# Patient Record
Sex: Female | Born: 1970 | Race: Black or African American | Hispanic: No | Marital: Single | Smoking: Never smoker
Health system: Southern US, Community
[De-identification: ages and names within clinical notes are randomized; demographics above are authoritative.]

## PROBLEM LIST (undated history)

## (undated) DIAGNOSIS — I10 Essential (primary) hypertension: Secondary | ICD-10-CM

## (undated) DIAGNOSIS — E7800 Pure hypercholesterolemia, unspecified: Secondary | ICD-10-CM

## (undated) HISTORY — PX: ABDOMINAL HYSTERECTOMY: SHX81

## (undated) HISTORY — PX: PLANTAR FASCIA RELEASE: SHX2239

## (undated) HISTORY — PX: TARSAL TUNNEL RELEASE: SUR1099

---

## 2009-12-28 ENCOUNTER — Emergency Department (HOSPITAL_COMMUNITY): Admission: EM | Admit: 2009-12-28 | Discharge: 2009-12-28 | Payer: Self-pay | Admitting: Emergency Medicine

## 2010-06-23 ENCOUNTER — Emergency Department (HOSPITAL_COMMUNITY)
Admission: EM | Admit: 2010-06-23 | Discharge: 2010-06-24 | Disposition: A | Discharge status: Home / Self Care | Payer: Self-pay | Attending: Emergency Medicine | Admitting: Emergency Medicine

## 2010-06-23 DIAGNOSIS — R0789 Other chest pain: Secondary | ICD-10-CM | POA: Insufficient documentation

## 2010-06-23 DIAGNOSIS — R110 Nausea: Secondary | ICD-10-CM | POA: Insufficient documentation

## 2010-06-23 DIAGNOSIS — M546 Pain in thoracic spine: Secondary | ICD-10-CM | POA: Insufficient documentation

## 2010-06-23 DIAGNOSIS — E119 Type 2 diabetes mellitus without complications: Secondary | ICD-10-CM | POA: Insufficient documentation

## 2010-06-23 DIAGNOSIS — M79609 Pain in unspecified limb: Secondary | ICD-10-CM | POA: Insufficient documentation

## 2010-06-23 DIAGNOSIS — I10 Essential (primary) hypertension: Secondary | ICD-10-CM | POA: Insufficient documentation

## 2010-06-23 DIAGNOSIS — R209 Unspecified disturbances of skin sensation: Secondary | ICD-10-CM | POA: Insufficient documentation

## 2010-06-24 ENCOUNTER — Emergency Department (HOSPITAL_COMMUNITY): Payer: Self-pay

## 2010-06-24 LAB — POCT CARDIAC MARKERS
CKMB, poc: <1 ng/mL — ABNORMAL LOW (ref 1.0–8.0)
Myoglobin, poc: 46 ng/mL (ref 12–200)
Troponin i, poc: <0.05 ng/mL (ref 0.00–0.09)

## 2010-06-24 LAB — CBC
HCT: 36.6 % (ref 36.0–46.0)
Hemoglobin: 12.3 g/dL (ref 12.0–15.0)
MCH: 26.7 pg (ref 26.0–34.0)
RBC: 4.6 MIL/uL (ref 3.87–5.11)
WBC: 8.7 10*3/uL (ref 4.0–10.5)

## 2010-06-24 LAB — POCT I-STAT, CHEM 8
Calcium, Ion: 1.22 mmol/L (ref 1.12–1.32)
Chloride: 103 mEq/L (ref 96–112)
Creatinine, Ser: 0.7 mg/dL (ref 0.4–1.2)
Glucose, Bld: 183 mg/dL — ABNORMAL HIGH (ref 70–99)
TCO2: 25 mmol/L (ref 0–100)

## 2010-06-24 LAB — DIFFERENTIAL
Basophils Absolute: 0.1 10*3/uL (ref 0.0–0.1)
Basophils Relative: 1 % (ref 0–1)
Lymphocytes Relative: 44 % (ref 12–46)
Monocytes Absolute: 0.3 10*3/uL (ref 0.1–1.0)
Monocytes Relative: 4 % (ref 3–12)
Neutrophils Relative %: 50 % (ref 43–77)

## 2010-06-24 LAB — POCT PREGNANCY, URINE: Preg Test, Ur: NEGATIVE

## 2010-07-17 LAB — POCT CARDIAC MARKERS
CKMB, poc: <1 ng/mL — ABNORMAL LOW (ref 1.0–8.0)
Myoglobin, poc: 13.7 ng/mL (ref 12–200)
Troponin i, poc: <0.05 ng/mL (ref 0.00–0.09)

## 2010-07-17 LAB — URINALYSIS, ROUTINE W REFLEX MICROSCOPIC
Hgb urine dipstick: NEGATIVE
Ketones, ur: 15 mg/dL — AB
Protein, ur: NEGATIVE mg/dL
Specific Gravity, Urine: 1.037 — ABNORMAL HIGH (ref 1.005–1.030)
Urobilinogen, UA: 0.2 mg/dL (ref 0.0–1.0)

## 2010-07-17 LAB — DIFFERENTIAL
Basophils Absolute: 0.1 10*3/uL (ref 0.0–0.1)
Lymphocytes Relative: 42 % (ref 12–46)
Neutro Abs: 4.5 10*3/uL (ref 1.7–7.7)

## 2010-07-17 LAB — CBC
HCT: 38.8 % (ref 36.0–46.0)
Hemoglobin: 13.5 g/dL (ref 12.0–15.0)
MCH: 28.8 pg (ref 26.0–34.0)
MCV: 82.7 fL (ref 78.0–100.0)
RDW: 12.1 % (ref 11.5–15.5)

## 2010-07-17 LAB — BASIC METABOLIC PANEL
BUN: 11 mg/dL (ref 6–23)
CO2: 21 mEq/L (ref 19–32)
GFR calc Af Amer: >60 mL/min (ref >60)
Glucose, Bld: 371 mg/dL — ABNORMAL HIGH (ref 70–99)
Sodium: 133 mEq/L — ABNORMAL LOW (ref 135–145)

## 2010-07-17 LAB — URINE MICROSCOPIC-ADD ON

## 2011-03-29 ENCOUNTER — Encounter: Payer: Self-pay | Admitting: *Deleted

## 2011-03-29 ENCOUNTER — Emergency Department (HOSPITAL_COMMUNITY)
Admission: EM | Admit: 2011-03-29 | Discharge: 2011-03-29 | Disposition: A | Discharge status: Home / Self Care | Payer: Self-pay | Attending: Emergency Medicine | Admitting: Emergency Medicine

## 2011-03-29 DIAGNOSIS — E789 Disorder of lipoprotein metabolism, unspecified: Secondary | ICD-10-CM | POA: Insufficient documentation

## 2011-03-29 DIAGNOSIS — M62838 Other muscle spasm: Secondary | ICD-10-CM

## 2011-03-29 DIAGNOSIS — E119 Type 2 diabetes mellitus without complications: Secondary | ICD-10-CM | POA: Insufficient documentation

## 2011-03-29 DIAGNOSIS — M546 Pain in thoracic spine: Secondary | ICD-10-CM | POA: Insufficient documentation

## 2011-03-29 DIAGNOSIS — M2560 Stiffness of unspecified joint, not elsewhere classified: Secondary | ICD-10-CM | POA: Insufficient documentation

## 2011-03-29 DIAGNOSIS — I10 Essential (primary) hypertension: Secondary | ICD-10-CM | POA: Insufficient documentation

## 2011-03-29 HISTORY — DX: Essential (primary) hypertension: I10

## 2011-03-29 HISTORY — DX: Pure hypercholesterolemia, unspecified: E78.00

## 2011-03-29 MED ORDER — OXYCODONE-ACETAMINOPHEN 5-325 MG PO TABS
1.0000 | ORAL_TABLET | Freq: Four times a day (QID) | ORAL | Status: AC | PRN
Start: 2011-03-29 — End: 2011-04-08

## 2011-03-29 MED ORDER — OXYCODONE-ACETAMINOPHEN 5-325 MG PO TABS
1.0000 | ORAL_TABLET | Freq: Once | ORAL | Status: AC
  Administered 2011-03-29: 1 via ORAL
  Filled 2011-03-29: qty 1

## 2011-03-29 MED ORDER — CYCLOBENZAPRINE HCL 10 MG PO TABS: 10.0000 mg | ORAL_TABLET | Freq: Two times a day (BID) | ORAL | Status: AC | PRN

## 2011-03-29 NOTE — ED Provider Notes (Signed)
History     CSN: [id number] Arrival date & time: 03/29/2011  9:24 AM   First MD Initiated Contact with Patient 03/29/11 1027      Chief Complaint  Patient presents with  . Back Pain    (Consider location/radiation/quality/duration/timing/severity/associated sxs/prior treatment) Patient is a 40 y.o. female presenting with back pain. The history is provided by the patient.  Back Pain  This is a new problem. The current episode started 3 to 5 hours ago. The problem occurs constantly. The problem has not changed since onset.The pain is associated with no known injury. The pain is present in the thoracic spine. The quality of the pain is described as stabbing and shooting. The pain does not radiate. The pain is at a severity of 9/10. The pain is severe. The symptoms are aggravated by bending, twisting and certain positions. The pain is the same all the time. Stiffness is present all day. Pertinent negatives include no chest pain, no fever, no numbness, no paresthesias, no paresis, no tingling and no weakness. She has tried nothing for the symptoms. The treatment provided no relief.    Past Medical History  Diagnosis Date  . Diabetes mellitus   . Hypertension   . High cholesterol     History reviewed. No pertinent past surgical history.  History reviewed. No pertinent family history.  History  Substance Use Topics  . Smoking status: Never Smoker   . Smokeless tobacco: Not on file  . Alcohol Use: Yes     occ    OB History    Grav Para Term Preterm Abortions TAB SAB Ect Mult Living                  Review of Systems  Constitutional: Negative for fever.  Cardiovascular: Negative for chest pain.  Musculoskeletal: Positive for back pain.  Neurological: Negative for tingling, weakness, numbness and paresthesias.  All other systems reviewed and are negative.    Allergies  Review of patient's allergies indicates no known allergies.  Home Medications  No current outpatient  prescriptions on file.  BP 157/79  Pulse 90  Temp 97.2 F (36.2 C)  Resp 20  SpO2 100%  LMP 03/26/2011  Physical Exam  Nursing note and vitals reviewed. Constitutional: She is oriented to person, place, and time. She appears well-developed and well-nourished. She appears distressed.  HENT:  Head: Normocephalic and atraumatic.  Mouth/Throat: Oropharynx is clear and moist.  Eyes: EOM are normal. Pupils are equal, round, and reactive to light.  Neck: Normal range of motion. Neck supple. No spinous process tenderness and no muscular tenderness present. No rigidity. Normal range of motion present.  Cardiovascular: Normal rate, regular rhythm, normal heart sounds and intact distal pulses.   No murmur heard. Pulmonary/Chest: Effort normal and breath sounds normal. She has no wheezes. She has no rales.  Abdominal: Soft. She exhibits no distension. There is no tenderness. There is no CVA tenderness.  Musculoskeletal: She exhibits no tenderness.       Thoracic back: She exhibits decreased range of motion and tenderness.       Lumbar back: Normal. She exhibits normal range of motion and no tenderness.       Back:  Neurological: She is alert and oriented to person, place, and time. Coordination normal.  Reflex Scores:      Patellar reflexes are 1+ on the right side and 1+ on the left side. Skin: Skin is warm and dry. No rash noted.  Psychiatric: She has a  normal mood and affect.    ED Course  Procedures (including critical care time)  Labs Reviewed - No data to display No results found.   No diagnosis found.    MDM   Pt with left upper back pain consistent with trapezius spasm.  Pt denies any fever, SOB or resp sx.  No lower back pain or weakness, parasthesias.  Will treat pain and d/c home.        Gwyneth Sprout, MD 03/29/11 1051

## 2011-03-29 NOTE — ED Notes (Signed)
Reports waking up this am with upper back pain, increases with movement and turning her head. No acute distress noted at triage, ambulatory.

## 2011-09-17 ENCOUNTER — Emergency Department (HOSPITAL_COMMUNITY)
Admission: EM | Admit: 2011-09-17 | Discharge: 2011-09-18 | Disposition: A | Discharge status: Home / Self Care | Payer: Self-pay | Attending: Emergency Medicine | Admitting: Emergency Medicine

## 2011-09-17 DIAGNOSIS — A499 Bacterial infection, unspecified: Secondary | ICD-10-CM | POA: Insufficient documentation

## 2011-09-17 DIAGNOSIS — R102 Pelvic and perineal pain: Secondary | ICD-10-CM

## 2011-09-17 DIAGNOSIS — N949 Unspecified condition associated with female genital organs and menstrual cycle: Secondary | ICD-10-CM | POA: Insufficient documentation

## 2011-09-17 DIAGNOSIS — R300 Dysuria: Secondary | ICD-10-CM | POA: Insufficient documentation

## 2011-09-17 DIAGNOSIS — N760 Acute vaginitis: Secondary | ICD-10-CM | POA: Insufficient documentation

## 2011-09-17 DIAGNOSIS — B9689 Other specified bacterial agents as the cause of diseases classified elsewhere: Secondary | ICD-10-CM | POA: Insufficient documentation

## 2011-09-17 DIAGNOSIS — E119 Type 2 diabetes mellitus without complications: Secondary | ICD-10-CM | POA: Insufficient documentation

## 2011-09-17 DIAGNOSIS — E7800 Pure hypercholesterolemia, unspecified: Secondary | ICD-10-CM | POA: Insufficient documentation

## 2011-09-17 DIAGNOSIS — I10 Essential (primary) hypertension: Secondary | ICD-10-CM | POA: Insufficient documentation

## 2011-09-17 NOTE — ED Notes (Addendum)
Pt states burning with urination and frequent urination since yesterday. Pt states she has yeast infection before and is experiencing itching as well as open sores in vaginal area. Pt states she is taking doxycycline for diagnosed UTI. Pt had mirana placed in April 25th.

## 2011-09-18 ENCOUNTER — Encounter (HOSPITAL_COMMUNITY): Payer: Self-pay | Admitting: *Deleted

## 2011-09-18 LAB — WET PREP, GENITAL: Trich, Wet Prep: NONE SEEN

## 2011-09-18 LAB — URINALYSIS, ROUTINE W REFLEX MICROSCOPIC
Glucose, UA: >1000 mg/dL — AB
Hgb urine dipstick: NEGATIVE
Leukocytes, UA: NEGATIVE
Nitrite: NEGATIVE
Protein, ur: 30 mg/dL — AB
Urobilinogen, UA: 1 mg/dL (ref 0.0–1.0)

## 2011-09-18 MED ORDER — METRONIDAZOLE 500 MG PO TABS: 500.0000 mg | ORAL_TABLET | Freq: Two times a day (BID) | ORAL | Status: AC

## 2011-09-18 NOTE — ED Provider Notes (Signed)
History     CSN: [id number]  Arrival date & time 09/17/11  2355   First MD Initiated Contact with Patient 09/18/11 0038      Chief Complaint  Patient presents with  . Urinary Tract Infection    (Consider location/radiation/quality/duration/timing/severity/associated sxs/prior treatment) HPI Comments: Having burning with urination.  Had mirena placed in April.  Was started on doxy at that time for "an infection".  Denies discharge.  Sexually active with female partner.    Patient is a 41 y.o. female presenting with urinary tract infection. The history is provided by the patient.  Urinary Tract Infection This is a new problem. Episode onset: 2 days ago. The problem occurs constantly. The problem has been gradually worsening. Pertinent negatives include no abdominal pain. Exacerbated by: urinating. The symptoms are relieved by nothing. She has tried nothing for the symptoms.    Past Medical History  Diagnosis Date  . Diabetes mellitus   . Hypertension   . High cholesterol     History reviewed. No pertinent past surgical history.  History reviewed. No pertinent family history.  History  Substance Use Topics  . Smoking status: Never Smoker   . Smokeless tobacco: Not on file  . Alcohol Use: Yes     occ    OB History    Grav Para Term Preterm Abortions TAB SAB Ect Mult Living                  Review of Systems  Gastrointestinal: Negative for abdominal pain.  All other systems reviewed and are negative.    Allergies  Morphine and related  Home Medications   Current Outpatient Rx  Name Route Sig Dispense Refill  . DOXYCYCLINE HYCLATE 100 MG PO CAPS Oral Take 100 mg by mouth 2 (two) times daily. For 14 days starting 09/04/11    . GLYBURIDE 2.5 MG PO TABS Oral Take 1.25 mg by mouth daily with breakfast.    . METFORMIN HCL 500 MG PO TABS Oral Take 500 mg by mouth daily.      BP 149/84  Pulse 85  Temp(Src) 98.3 F (36.8 C) (Oral)  Resp 16  SpO2 98%  Physical  Exam  Nursing note and vitals reviewed. Constitutional: She is oriented to person, place, and time. She appears well-developed and well-nourished.  HENT:  Head: Normocephalic.  Neck: Normal range of motion. Neck supple.  Cardiovascular: Normal rate and regular rhythm.   No murmur heard. Pulmonary/Chest: Effort normal and breath sounds normal. No respiratory distress.  Abdominal: Soft. Bowel sounds are normal. She exhibits no distension. There is no tenderness.  Genitourinary: Uterus normal. Vaginal discharge found.       Mild to moderate whitish discharge.  Musculoskeletal: Normal range of motion. She exhibits no edema.  Neurological: She is alert and oriented to person, place, and time.  Skin: Skin is warm and dry.    ED Course  Procedures (including critical care time)  Labs Reviewed  URINALYSIS, ROUTINE W REFLEX MICROSCOPIC - Abnormal; Notable for the following:    APPearance CLOUDY (*)    Specific Gravity, Urine 1.040 (*)    Glucose, UA >1000 (*)    Ketones, ur 15 (*)    Protein, ur 30 (*)    All other components within normal limits  URINE MICROSCOPIC-ADD ON - Abnormal; Notable for the following:    Squamous Epithelial / LPF FEW (*)    Bacteria, UA FEW (*)    Crystals CA OXALATE CRYSTALS (*)  All other components within normal limits  POCT PREGNANCY, URINE   No results found.   No diagnosis found.    MDM  The ua looks okay.  The wet prep does reveal clue cells.  Will discharge with flagyl, to follow up prn.        Geoffery Lyons, MD 09/18/11 [phone]

## 2011-09-18 NOTE — ED Notes (Signed)
Rx x 1, pt voiced understanding to f/u with PCP and return for worsening condition. 

## 2011-09-18 NOTE — ED Notes (Signed)
Pt dx with UTI and has been on doxycycline for approx 2 weeks.  C/o burning on urination and irritation and possible sore spot around vaginal area.  States "it could be part of a hemorrhoid".

## 2011-09-18 NOTE — Discharge Instructions (Signed)
Abdominal Pain, Women Abdominal (stomach, pelvic, or belly) pain can be caused by many things. It is important to tell your doctor:  The location of the pain.   Does it come and go or is it present all the time?   Are there things that start the pain (eating certain foods, exercise)?   Are there other symptoms associated with the pain (fever, nausea, vomiting, diarrhea)?  All of this is helpful to know when trying to find the cause of the pain. CAUSES   Stomach: virus or bacteria infection, or ulcer.   Intestine: appendicitis (inflamed appendix), regional ileitis (Crohn's disease), ulcerative colitis (inflamed colon), irritable bowel syndrome, diverticulitis (inflamed diverticulum of the colon), or cancer of the stomach or intestine.   Gallbladder disease or stones in the gallbladder.   Kidney disease, kidney stones, or infection.   Pancreas infection or cancer.   Fibromyalgia (pain disorder).   Diseases of the female organs:   Uterus: fibroid (non-cancerous) tumors or infection.   Fallopian tubes: infection or tubal pregnancy.   Ovary: cysts or tumors.   Pelvic adhesions (scar tissue).   Endometriosis (uterus lining tissue growing in the pelvis and on the pelvic organs).   Pelvic congestion syndrome (female organs filling up with blood just before the menstrual period).   Pain with the menstrual period.   Pain with ovulation (producing an egg).   Pain with an IUD (intrauterine device, birth control) in the uterus.   Cancer of the female organs.   Functional pain (pain not caused by a disease, may improve without treatment).   Psychological pain.   Depression.  DIAGNOSIS  Your doctor will decide the seriousness of your pain by doing an examination.  Blood tests.   X-rays.   Ultrasound.   CT scan (computed tomography, special type of X-ray).   MRI (magnetic resonance imaging).   Cultures, for infection.   Barium enema (dye inserted in the large  intestine, to better view it with X-rays).   Colonoscopy (looking in intestine with a lighted tube).   Laparoscopy (minor surgery, looking in abdomen with a lighted tube).   Major abdominal exploratory surgery (looking in abdomen with a large incision).  TREATMENT  The treatment will depend on the cause of the pain.   Many cases can be observed and treated at home.   Over-the-counter medicines recommended by your caregiver.   Prescription medicine.   Antibiotics, for infection.   Birth control pills, for painful periods or for ovulation pain.   Hormone treatment, for endometriosis.   Nerve blocking injections.   Physical therapy.   Antidepressants.   Counseling with a psychologist or psychiatrist.   Minor or major surgery.  HOME CARE INSTRUCTIONS   Do not take laxatives, unless directed by your caregiver.   Take over-the-counter pain medicine only if ordered by your caregiver. Do not take aspirin because it can cause an upset stomach or bleeding.   Try a clear liquid diet (broth or water) as ordered by your caregiver. Slowly move to a bland diet, as tolerated, if the pain is related to the stomach or intestine.   Have a thermometer and take your temperature several times a day, and record it.   Bed rest and sleep, if it helps the pain.   Avoid sexual intercourse, if it causes pain.   Avoid stressful situations.   Keep your follow-up appointments and tests, as your caregiver orders.   If the pain does not go away with medicine or surgery, you may   try:   Acupuncture.   Relaxation exercises (yoga, meditation).   Group therapy.   Counseling.  SEEK MEDICAL CARE IF:   You notice certain foods cause stomach pain.   Your home care treatment is not helping your pain.   You need stronger pain medicine.   You want your IUD removed.   You feel faint or lightheaded.   You develop nausea and vomiting.   You develop a rash.   You are having side effects or  an allergy to your medicine.  SEEK IMMEDIATE MEDICAL CARE IF:   Your pain does not go away or gets worse.   You have a fever.   Your pain is felt only in portions of the abdomen. The right side could possibly be appendicitis. The left lower portion of the abdomen could be colitis or diverticulitis.   You are passing blood in your stools (bright red or black tarry stools, with or without vomiting).   You have blood in your urine.   You develop chills, with or without a fever.   You pass out.  MAKE SURE YOU:   Understand these instructions.   Will watch your condition.   Will get help right away if you are not doing well or get worse.  Document Released: 02/14/2007 Document Revised: 04/08/2011 Document Reviewed: 03/06/2009 [employer] Patient Information 2012 [employer]. 

## 2012-04-08 ENCOUNTER — Emergency Department (HOSPITAL_BASED_OUTPATIENT_CLINIC_OR_DEPARTMENT_OTHER): Payer: Self-pay

## 2012-04-08 ENCOUNTER — Emergency Department (HOSPITAL_BASED_OUTPATIENT_CLINIC_OR_DEPARTMENT_OTHER)
Admission: EM | Admit: 2012-04-08 | Discharge: 2012-04-09 | Disposition: A | Discharge status: Home / Self Care | Payer: Self-pay | Attending: Emergency Medicine | Admitting: Emergency Medicine

## 2012-04-08 DIAGNOSIS — R0789 Other chest pain: Secondary | ICD-10-CM | POA: Insufficient documentation

## 2012-04-08 DIAGNOSIS — E7800 Pure hypercholesterolemia, unspecified: Secondary | ICD-10-CM | POA: Insufficient documentation

## 2012-04-08 DIAGNOSIS — R51 Headache: Secondary | ICD-10-CM | POA: Insufficient documentation

## 2012-04-08 DIAGNOSIS — Z79899 Other long term (current) drug therapy: Secondary | ICD-10-CM | POA: Insufficient documentation

## 2012-04-08 DIAGNOSIS — E119 Type 2 diabetes mellitus without complications: Secondary | ICD-10-CM | POA: Insufficient documentation

## 2012-04-08 DIAGNOSIS — R0602 Shortness of breath: Secondary | ICD-10-CM | POA: Insufficient documentation

## 2012-04-08 DIAGNOSIS — I10 Essential (primary) hypertension: Secondary | ICD-10-CM | POA: Insufficient documentation

## 2012-04-08 LAB — CBC WITH DIFFERENTIAL/PLATELET
Basophils Absolute: 0 10*3/uL (ref 0.0–0.1)
Basophils Relative: 0 % (ref 0–1)
HCT: 37.3 % (ref 36.0–46.0)
Hemoglobin: 13.2 g/dL (ref 12.0–15.0)
Lymphocytes Relative: 32 % (ref 12–46)
Monocytes Absolute: 0.7 10*3/uL (ref 0.1–1.0)
Neutro Abs: 6 10*3/uL (ref 1.7–7.7)
Neutrophils Relative %: 60 % (ref 43–77)
RDW: 14.5 % (ref 11.5–15.5)
WBC: 10 10*3/uL (ref 4.0–10.5)

## 2012-04-08 LAB — BASIC METABOLIC PANEL
Chloride: 102 mEq/L (ref 96–112)
Creatinine, Ser: 0.6 mg/dL (ref 0.50–1.10)
GFR calc Af Amer: >90 mL/min (ref >90)
GFR calc non Af Amer: >90 mL/min (ref >90)
Potassium: 3.9 mEq/L (ref 3.5–5.1)

## 2012-04-08 LAB — D-DIMER, QUANTITATIVE: D-Dimer, Quant: <0.27 ug/mL-FEU (ref 0.00–0.48)

## 2012-04-08 LAB — CK TOTAL AND CKMB (NOT AT ARMC): Relative Index: INVALID (ref 0.0–2.5)

## 2012-04-08 LAB — TROPONIN I: Troponin I: <0.3 ng/mL (ref <0.30)

## 2012-04-08 MED ORDER — HYDROCODONE-ACETAMINOPHEN 5-325 MG PO TABS
2.0000 | ORAL_TABLET | Freq: Once | ORAL | Status: AC
  Administered 2012-04-08: 2 via ORAL
  Filled 2012-04-08: qty 2

## 2012-04-08 NOTE — ED Provider Notes (Signed)
History   This chart was scribed for Sara Sou, MD by Leone Payor, ED Scribe. This patient was seen in room MH09/MH09 and the patient's care was started at 10:05 PM.   CSN: [id number]  Arrival date & time 04/08/12  1954   None     Chief Complaint  Patient presents with  . Chest Pain    left chest pain that increase with deep breathing denies radiation     The history is provided by the patient. No language interpreter was used.    Sara Thornton is a 41 y.o. female who presents to the Emergency Department complaining of moderate to severe intermittent chest pain starting 1.5 weeks ago. Pain is described as coming and going and lasts for 3-4 minutes each time and occur every 15 minutes. Pain is worse with deep breathes, movement, lying down. Pt has associated SOB while lying back but denies a cough, fever, or weakness. Pt reports currently is in pain. Her last normal period started around Nov 20th.    Family history mother with history of "mini strokes"   Pt has h/o of DM, HTN, high cholesterol.  Pt denies smoking but is occasional alcohol user. No drug use  Past Medical History  Diagnosis Date  . Diabetes mellitus   . Hypertension   . High cholesterol     No past surgical history on file.  No family history on file.  History  Substance Use Topics  . Smoking status: Never Smoker   . Smokeless tobacco: Not on file  . Alcohol Use: Yes     Comment: occ    No OB history provided.   Review of Systems  Constitutional: Negative.  Negative for fever.  Respiratory: Positive for shortness of breath. Negative for cough.   Cardiovascular: Positive for chest pain.  Gastrointestinal: Negative.   Musculoskeletal: Negative.   Skin: Negative.   Neurological: Positive for headaches. Negative for weakness.       Patient has had diffuse headache since in the emergency department  Hematological: Negative.   Psychiatric/Behavioral: Negative.   All other systems reviewed and  are negative.    Allergies  Morphine and related  Home Medications   Current Outpatient Rx  Name  Route  Sig  Dispense  Refill  . MEDROXYPROGESTERONE ACETATE 10 MG PO TABS   Oral   Take 10 mg by mouth daily.         Marland Kitchen DOXYCYCLINE HYCLATE 100 MG PO CAPS   Oral   Take 100 mg by mouth 2 (two) times daily. For 14 days starting 09/04/11         . GLYBURIDE 2.5 MG PO TABS   Oral   Take 1.25 mg by mouth daily with breakfast.         . METFORMIN HCL 500 MG PO TABS   Oral   Take 1,000 mg by mouth 2 (two) times daily.            BP 104/68  Pulse 78  Temp 98.8 F (37.1 C) (Oral)  Resp 18  Ht 5\' 2"  (1.575 m)  Wt 168 lb (76.204 kg)  BMI 30.73 kg/m2  SpO2 97%  Physical Exam  Nursing note and vitals reviewed. Constitutional: She appears well-developed and well-nourished.  HENT:  Head: Normocephalic and atraumatic.  Eyes: Conjunctivae normal are normal. Pupils are equal, round, and reactive to light.  Neck: Neck supple. No tracheal deviation present. No thyromegaly present.  Cardiovascular: Normal rate and regular rhythm.   No  murmur heard. Pulmonary/Chest: Effort normal and breath sounds normal.       Left anterior chest wall is tender, reproducing pain. Pain is also reproduced by forcible flexion of left shoulder  Abdominal: Soft. Bowel sounds are normal. She exhibits no distension. There is no tenderness.  Musculoskeletal: Normal range of motion. She exhibits no edema and no tenderness.  Neurological: She is alert. Coordination normal.  Skin: Skin is warm and dry. No rash noted.  Psychiatric: She has a normal mood and affect.    ED Course  Procedures (including critical care time)  Date: 04/08/2012  Rate: 80  Rhythm: normal sinus rhythm  QRS Axis: normal  Intervals: normal  ST/T Wave abnormalities: nonspecific T wave changes  Conduction Disutrbances:none  Narrative Interpretation:   Old EKG Reviewed: Unchanged from 06/24/2010 interpreted by me  DIAGNOSTIC  STUDIES: Oxygen Saturation is 99% on room air, normal by my interpretation.    COORDINATION OF CARE:     Labs Reviewed  BASIC METABOLIC PANEL - Abnormal; Notable for the following:    Glucose, Bld 241 (*)     All other components within normal limits  CBC WITH DIFFERENTIAL - Abnormal; Notable for the following:    MCV 75.8 (*)     All other components within normal limits  CK TOTAL AND CKMB  TROPONIN I  D-DIMER, QUANTITATIVE   Dg Chest Portable 1 View  04/08/2012  *RADIOLOGY REPORT*  Clinical Data: Chest pain.  PORTABLE CHEST - 1 VIEW  Comparison: 06/24/2010  Findings: Heart and mediastinal contours are within normal limits. No focal opacities or effusions.  No acute bony abnormality.  IMPRESSION: No active cardiopulmonary disease.   Original Report Authenticated By: Charlett Nose, M.D.      No diagnosis found.  12 midnight patient feels improved after treatment with Vicodin.  MDM  Doubt acute coronary syndrome i.e. no EKG evidence atypical symptoms ,2 negative markers. Low pretest clinical probability for pulmonary embolism and negative d-dimer. Exam consistent with musculoskeletal chest pain. Headache is felt to be nonspecific. Plan prescription Vicodin, followup with PMD Diagnosis #1 atypical chest pain #2 nonspecific headache #3 hyperglycemia     I personally performed the services described in this documentation, which was scribed in my presence. The recorded information has been reviewed and is accurate.     Sara Sou, MD 04/09/12 [phone]

## 2012-04-08 NOTE — ED Notes (Signed)
MD at bedside. 

## 2012-04-08 NOTE — ED Notes (Signed)
Pt report intermittant chest pain left chest nonradiaitng that increase with deep breathing currently denies SOB  But CP 5/10 skin P/W/D

## 2012-04-09 MED ORDER — HYDROCODONE-ACETAMINOPHEN 5-325 MG PO TABS: 2.0000 | ORAL_TABLET | ORAL | Status: DC | PRN

## 2012-12-15 ENCOUNTER — Encounter (HOSPITAL_BASED_OUTPATIENT_CLINIC_OR_DEPARTMENT_OTHER): Payer: Self-pay | Admitting: *Deleted

## 2012-12-15 DIAGNOSIS — Z79899 Other long term (current) drug therapy: Secondary | ICD-10-CM | POA: Insufficient documentation

## 2012-12-15 DIAGNOSIS — R109 Unspecified abdominal pain: Secondary | ICD-10-CM | POA: Insufficient documentation

## 2012-12-15 DIAGNOSIS — I10 Essential (primary) hypertension: Secondary | ICD-10-CM | POA: Insufficient documentation

## 2012-12-15 DIAGNOSIS — E119 Type 2 diabetes mellitus without complications: Secondary | ICD-10-CM | POA: Insufficient documentation

## 2012-12-15 DIAGNOSIS — E7800 Pure hypercholesterolemia, unspecified: Secondary | ICD-10-CM | POA: Insufficient documentation

## 2012-12-15 LAB — URINALYSIS, ROUTINE W REFLEX MICROSCOPIC
Bilirubin Urine: NEGATIVE
Ketones, ur: 15 mg/dL — AB
Specific Gravity, Urine: 1.043 — ABNORMAL HIGH (ref 1.005–1.030)

## 2012-12-15 LAB — URINE MICROSCOPIC-ADD ON

## 2012-12-15 NOTE — ED Notes (Signed)
Pt began having right side pain that radiates to her right flank pt denies urinary sx denies N/V or fever

## 2012-12-16 ENCOUNTER — Emergency Department (HOSPITAL_BASED_OUTPATIENT_CLINIC_OR_DEPARTMENT_OTHER)
Admission: EM | Admit: 2012-12-16 | Discharge: 2012-12-16 | Disposition: A | Discharge status: Home / Self Care | Payer: BC Managed Care – PPO | Attending: Emergency Medicine | Admitting: Emergency Medicine

## 2012-12-16 ENCOUNTER — Emergency Department (HOSPITAL_BASED_OUTPATIENT_CLINIC_OR_DEPARTMENT_OTHER): Payer: BC Managed Care – PPO

## 2012-12-16 DIAGNOSIS — R109 Unspecified abdominal pain: Secondary | ICD-10-CM

## 2012-12-16 LAB — COMPREHENSIVE METABOLIC PANEL
Albumin: 4.1 g/dL (ref 3.5–5.2)
Alkaline Phosphatase: 57 U/L (ref 39–117)
BUN: 9 mg/dL (ref 6–23)
Chloride: 101 mEq/L (ref 96–112)
GFR calc Af Amer: >90 mL/min (ref >90)
Glucose, Bld: 208 mg/dL — ABNORMAL HIGH (ref 70–99)
Potassium: 3.7 mEq/L (ref 3.5–5.1)
Total Bilirubin: 0.2 mg/dL — ABNORMAL LOW (ref 0.3–1.2)

## 2012-12-16 LAB — CBC WITH DIFFERENTIAL/PLATELET
Hemoglobin: 12.8 g/dL (ref 12.0–15.0)
Lymphs Abs: 2.5 10*3/uL (ref 0.7–4.0)
Monocytes Relative: 5 % (ref 3–12)
Neutro Abs: 4.6 10*3/uL (ref 1.7–7.7)
Neutrophils Relative %: 60 % (ref 43–77)
RBC: 4.77 MIL/uL (ref 3.87–5.11)

## 2012-12-16 LAB — LIPASE, BLOOD: Lipase: 28 U/L (ref 11–59)

## 2012-12-16 MED ORDER — HYDROCODONE-ACETAMINOPHEN 5-325 MG PO TABS
2.0000 | ORAL_TABLET | Freq: Once | ORAL | Status: AC
  Administered 2012-12-16: 2 via ORAL
  Filled 2012-12-16: qty 2

## 2012-12-16 MED ORDER — KETOROLAC TROMETHAMINE 60 MG/2ML IM SOLN
60.0000 mg | Freq: Once | INTRAMUSCULAR | Status: AC
  Administered 2012-12-16: 60 mg via INTRAMUSCULAR
  Filled 2012-12-16: qty 2

## 2012-12-16 MED ORDER — HYDROCODONE-ACETAMINOPHEN 5-325 MG PO TABS: 2.0000 | ORAL_TABLET | ORAL | Status: AC | PRN

## 2012-12-16 NOTE — ED Provider Notes (Signed)
CSN: [id number]     Arrival date & time 12/15/12  2304 History     First MD Initiated Contact with Patient 12/16/12 0035     Chief Complaint  Patient presents with  . Flank Pain   (Consider location/radiation/quality/duration/timing/severity/associated sxs/prior Treatment) HPI Comments: Patient is a 42 year old female who is status post hysterectomy in April of this year. She also has a history of hypertension, diabetes, and high cholesterol. She presents today with complaints of pain in the right flank and right lower quadrant which she's been experiencing for the past 4 days. She states the discomfort comes and goes. It is aggravated with movement and palpation and seems to be relieved with rest. She denies any fevers, chills, vomiting, diarrhea, or vaginal complaints.  Patient is a 42 y.o. female presenting with flank pain. The history is provided by the patient.  Flank Pain This is a new problem. Episode onset: 5 days ago. Episode frequency: Intermittently. The problem has been gradually worsening. Associated symptoms include abdominal pain. Nothing aggravates the symptoms. Nothing relieves the symptoms. She has tried nothing for the symptoms. The treatment provided no relief.    Past Medical History  Diagnosis Date  . Diabetes mellitus   . Hypertension   . High cholesterol    Past Surgical History  Procedure Laterality Date  . Abdominal hysterectomy     History reviewed. No pertinent family history. History  Substance Use Topics  . Smoking status: Never Smoker   . Smokeless tobacco: Not on file  . Alcohol Use: No     Comment: occ   OB History   Grav Para Term Preterm Abortions TAB SAB Ect Mult Living                 Review of Systems  Gastrointestinal: Positive for abdominal pain.  Genitourinary: Positive for flank pain.  All other systems reviewed and are negative.    Allergies  Morphine and related  Home Medications   Current Outpatient Rx  Name  Route   Sig  Dispense  Refill  . lisinopril (PRINIVIL,ZESTRIL) 20 MG tablet   Oral   Take 20 mg by mouth daily.         . metFORMIN (GLUCOPHAGE) 500 MG tablet   Oral   Take 1,000 mg by mouth 2 (two) times daily.          . pravastatin (PRAVACHOL) 10 MG tablet   Oral   Take 10 mg by mouth daily.          BP 157/84  Pulse 72  Temp(Src) 98.4 F (36.9 C) (Oral)  SpO2 100% Physical Exam  Nursing note and vitals reviewed. Constitutional: She is oriented to person, place, and time. She appears well-developed and well-nourished. No distress.  HENT:  Head: Normocephalic and atraumatic.  Neck: Normal range of motion. Neck supple.  Cardiovascular: Normal rate and regular rhythm.  Exam reveals no gallop and no friction rub.   No murmur heard. Pulmonary/Chest: Effort normal and breath sounds normal. No respiratory distress. She has no wheezes.  Abdominal: Soft. Bowel sounds are normal. She exhibits no distension and no mass. There is tenderness. There is no rebound and no guarding.  There is tenderness to palpation in the right lower quadrant and right flank. There is no rebound and no guarding. No masses are palpable.  Musculoskeletal: Normal range of motion.  Neurological: She is alert and oriented to person, place, and time.  Skin: Skin is warm and dry. She is not diaphoretic.  ED Course   Procedures (including critical care time)  Labs Reviewed  URINALYSIS, ROUTINE W REFLEX MICROSCOPIC - Abnormal; Notable for the following:    Specific Gravity, Urine 1.043 (*)    Glucose, UA >1000 (*)    Ketones, ur 15 (*)    All other components within normal limits  URINE MICROSCOPIC-ADD ON - Abnormal; Notable for the following:    Squamous Epithelial / LPF MANY (*)    Bacteria, UA FEW (*)    All other components within normal limits  CBC WITH DIFFERENTIAL  COMPREHENSIVE METABOLIC PANEL  LIPASE, BLOOD   No results found. No diagnosis found.  MDM  Patient presents with pain in the  right flank that made me suspicious for a kidney stone. There was no blood in the urine and CT was unremarkable. The lab work is unremarkable as well. I doubt biliary colic and there do not appear to be any obstructing kidney stones. She appears clinically well and stable for discharge. She will be treated with pain medication for a likely musculoskeletal etiology. She is to return as needed if her symptoms worsen or change.  Geoffery Lyons, MD 12/16/12 [phone]

## 2020-07-09 ENCOUNTER — Other Ambulatory Visit: Payer: Self-pay

## 2020-07-09 ENCOUNTER — Encounter (HOSPITAL_BASED_OUTPATIENT_CLINIC_OR_DEPARTMENT_OTHER): Payer: Self-pay | Admitting: Emergency Medicine

## 2020-07-09 ENCOUNTER — Emergency Department (HOSPITAL_BASED_OUTPATIENT_CLINIC_OR_DEPARTMENT_OTHER)
Admission: EM | Admit: 2020-07-09 | Discharge: 2020-07-09 | Disposition: A | Discharge status: Home / Self Care | Payer: 59 | Attending: Emergency Medicine | Admitting: Emergency Medicine

## 2020-07-09 ENCOUNTER — Emergency Department (HOSPITAL_BASED_OUTPATIENT_CLINIC_OR_DEPARTMENT_OTHER): Payer: 59

## 2020-07-09 DIAGNOSIS — I10 Essential (primary) hypertension: Secondary | ICD-10-CM | POA: Diagnosis not present

## 2020-07-09 DIAGNOSIS — R1013 Epigastric pain: Secondary | ICD-10-CM | POA: Insufficient documentation

## 2020-07-09 DIAGNOSIS — Z7984 Long term (current) use of oral hypoglycemic drugs: Secondary | ICD-10-CM | POA: Diagnosis not present

## 2020-07-09 DIAGNOSIS — E119 Type 2 diabetes mellitus without complications: Secondary | ICD-10-CM | POA: Insufficient documentation

## 2020-07-09 DIAGNOSIS — Z79899 Other long term (current) drug therapy: Secondary | ICD-10-CM | POA: Diagnosis not present

## 2020-07-09 DIAGNOSIS — R509 Fever, unspecified: Secondary | ICD-10-CM | POA: Insufficient documentation

## 2020-07-09 LAB — LIPASE, BLOOD: Lipase: 33 U/L (ref 11–51)

## 2020-07-09 LAB — CBC WITH DIFFERENTIAL/PLATELET
Abs Immature Granulocytes: 0.02 10*3/uL (ref 0.00–0.07)
Basophils Absolute: 0 10*3/uL (ref 0.0–0.1)
Basophils Relative: 0 %
Eosinophils Absolute: 0.1 10*3/uL (ref 0.0–0.5)
Eosinophils Relative: 2 %
HCT: 39.5 % (ref 36.0–46.0)
Hemoglobin: 13.8 g/dL (ref 12.0–15.0)
Immature Granulocytes: 0 %
Lymphocytes Relative: 18 %
Lymphs Abs: 1 10*3/uL (ref 0.7–4.0)
MCH: 29.7 pg (ref 26.0–34.0)
MCHC: 34.9 g/dL (ref 30.0–36.0)
MCV: 84.9 fL (ref 80.0–100.0)
Monocytes Absolute: 0.4 10*3/uL (ref 0.1–1.0)
Monocytes Relative: 7 %
Neutro Abs: 4 10*3/uL (ref 1.7–7.7)
Neutrophils Relative %: 73 %
Platelets: 261 10*3/uL (ref 150–400)
RBC: 4.65 MIL/uL (ref 3.87–5.11)
RDW: 11.8 % (ref 11.5–15.5)
WBC: 5.5 10*3/uL (ref 4.0–10.5)
nRBC: 0 % (ref 0.0–0.2)

## 2020-07-09 LAB — URINALYSIS, MICROSCOPIC (REFLEX)

## 2020-07-09 LAB — URINALYSIS, ROUTINE W REFLEX MICROSCOPIC
Bilirubin Urine: NEGATIVE
Glucose, UA: >500 mg/dL — AB
Hgb urine dipstick: NEGATIVE
Ketones, ur: 15 mg/dL — AB
Leukocytes,Ua: NEGATIVE
Nitrite: NEGATIVE
Protein, ur: NEGATIVE mg/dL
Specific Gravity, Urine: 1.02 (ref 1.005–1.030)
pH: 6 (ref 5.0–8.0)

## 2020-07-09 LAB — COMPREHENSIVE METABOLIC PANEL
ALT: 19 U/L (ref 0–44)
AST: 19 U/L (ref 15–41)
Albumin: 3.8 g/dL (ref 3.5–5.0)
Alkaline Phosphatase: 54 U/L (ref 38–126)
Anion gap: 8 (ref 5–15)
BUN: 8 mg/dL (ref 6–20)
CO2: 23 mmol/L (ref 22–32)
Calcium: 8.6 mg/dL — ABNORMAL LOW (ref 8.9–10.3)
Chloride: 101 mmol/L (ref 98–111)
Creatinine, Ser: 0.61 mg/dL (ref 0.44–1.00)
GFR, Estimated: >60 mL/min (ref >60)
Glucose, Bld: 300 mg/dL — ABNORMAL HIGH (ref 70–99)
Potassium: 3.8 mmol/L (ref 3.5–5.1)
Sodium: 132 mmol/L — ABNORMAL LOW (ref 135–145)
Total Bilirubin: 0.4 mg/dL (ref 0.3–1.2)
Total Protein: 6.1 g/dL — ABNORMAL LOW (ref 6.5–8.1)

## 2020-07-09 MED ORDER — IOHEXOL 300 MG/ML  SOLN
100.0000 mL | Freq: Once | INTRAMUSCULAR | Status: AC | PRN
  Administered 2020-07-09: 100 mL via INTRAVENOUS

## 2020-07-09 MED ORDER — FENTANYL CITRATE (PF) 100 MCG/2ML IJ SOLN
50.0000 ug | Freq: Once | INTRAMUSCULAR | Status: AC
  Administered 2020-07-09: 50 ug via INTRAVENOUS
  Filled 2020-07-09: qty 2

## 2020-07-09 MED ORDER — FENTANYL CITRATE (PF) 100 MCG/2ML IJ SOLN
25.0000 ug | Freq: Once | INTRAMUSCULAR | Status: AC
  Administered 2020-07-09: 25 ug via INTRAVENOUS
  Filled 2020-07-09: qty 2

## 2020-07-09 MED ORDER — ONDANSETRON HCL 4 MG/2ML IJ SOLN
4.0000 mg | Freq: Once | INTRAMUSCULAR | Status: AC
  Administered 2020-07-09: 4 mg via INTRAVENOUS
  Filled 2020-07-09: qty 2

## 2020-07-09 MED ORDER — ALUM & MAG HYDROXIDE-SIMETH 200-200-20 MG/5ML PO SUSP
30.0000 mL | Freq: Once | ORAL | Status: AC
  Administered 2020-07-09: 30 mL via ORAL
  Filled 2020-07-09: qty 30

## 2020-07-09 MED ORDER — OMEPRAZOLE 20 MG PO CPDR: 20.0000 mg | DELAYED_RELEASE_CAPSULE | Freq: Every day | ORAL | 0 refills | Status: AC

## 2020-07-09 NOTE — ED Provider Notes (Signed)
[facility] EMERGENCY DEPARTMENT Provider Note   CSN: [id number] Arrival date & time: 07/09/20  0250     History Chief Complaint  Patient presents with  . Abdominal Pain    Sara Thornton is a 50 y.o. female.  HPI     This a 50 year old female with a history of diabetes, hypertension, hyperlipidemia who presents with abdominal pain.  Patient reports 24 hours of epigastric abdominal pain.  She describes it as cramping and nonradiating.  She states it has progressively gotten worse.  She has not had anything to eat so does not know if that is related.  She reports fever to 100.3 at home.  She denies nausea, vomiting, change in bowel movements.  She denies urinary symptoms.  She denies chest pain or shortness of breath.  No known sick contacts or Covid exposures.  Past Medical History:  Diagnosis Date  . Diabetes mellitus   . High cholesterol   . Hypertension     There are no problems to display for this patient.   Past Surgical History:  Procedure Laterality Date  . ABDOMINAL HYSTERECTOMY       OB History   No obstetric history on file.     No family history on file.  Social History   Tobacco Use  . Smoking status: Never Smoker  Substance Use Topics  . Alcohol use: No    Comment: occ  . Drug use: No    Home Medications Prior to Admission medications   Medication Sig Start Date End Date Taking? Authorizing Provider  omeprazole (PRILOSEC) 20 MG capsule Take 1 capsule (20 mg total) by mouth daily. 07/09/20  Yes Raunel Dimartino, Mayer Masker, MD  HYDROcodone-acetaminophen (NORCO) 5-325 MG per tablet Take 2 tablets by mouth every 4 (four) hours as needed. 12/16/12   Geoffery Lyons, MD  lisinopril (PRINIVIL,ZESTRIL) 20 MG tablet Take 20 mg by mouth daily.    [provider]  metFORMIN (GLUCOPHAGE) 500 MG tablet Take 1,000 mg by mouth 2 (two) times daily.     [provider]  pravastatin (PRAVACHOL) 10 MG tablet Take 10 mg by mouth daily.     [provider]    Allergies    Morphine and related  Review of Systems   Review of Systems  Constitutional: Positive for fever.  Respiratory: Negative for shortness of breath.   Cardiovascular: Negative for chest pain.  Gastrointestinal: Positive for abdominal pain. Negative for diarrhea, nausea and vomiting.  Genitourinary: Negative for dysuria.  All other systems reviewed and are negative.   Physical Exam Updated Vital Signs BP (!) 151/77 (BP Location: Left Arm)   Pulse 90   Temp 98.9 F (37.2 C) (Oral)   Resp 18   Ht 1.575 m (5\' 2" )   Wt 70.3 kg   LMP 03/17/2012   SpO2 94%   BMI 28.35 kg/m   Physical Exam Vitals and nursing note reviewed.  Constitutional:      Appearance: She is well-developed and well-nourished.  HENT:     Head: Normocephalic and atraumatic.  Eyes:     Pupils: Pupils are equal, round, and reactive to light.  Cardiovascular:     Rate and Rhythm: Normal rate and regular rhythm.     Heart sounds: Normal heart sounds.  Pulmonary:     Effort: Pulmonary effort is normal. No respiratory distress.     Breath sounds: No wheezing.  Abdominal:     General: Bowel sounds are normal.     Palpations: Abdomen  is soft.     Tenderness: There is abdominal tenderness in the right upper quadrant and epigastric area. There is no guarding or rebound.  Musculoskeletal:     Cervical back: Neck supple.  Skin:    General: Skin is warm and dry.  Neurological:     Mental Status: She is alert and oriented to person, place, and time.  Psychiatric:        Mood and Affect: Mood and affect and mood normal.     ED Results / Procedures / Treatments   Labs (all labs ordered are listed, but only abnormal results are displayed) Labs Reviewed  URINALYSIS, ROUTINE W REFLEX MICROSCOPIC - Abnormal; Notable for the following components:      Result Value   Color, Urine STRAW (*)    APPearance CLEAR (*)    Glucose, UA >500 (*)    Ketones, ur 15 (*)    All other  components within normal limits  COMPREHENSIVE METABOLIC PANEL - Abnormal; Notable for the following components:   Sodium 132 (*)    Glucose, Bld 300 (*)    Calcium 8.6 (*)    Total Protein 6.1 (*)    All other components within normal limits  URINALYSIS, MICROSCOPIC (REFLEX) - Abnormal; Notable for the following components:   Bacteria, UA RARE (*)    All other components within normal limits  CBC WITH DIFFERENTIAL/PLATELET  LIPASE, BLOOD    EKG None  Radiology CT ABDOMEN PELVIS W CONTRAST  Result Date: 07/09/2020 CLINICAL DATA:  Abdominal pain, fever EXAM: CT ABDOMEN AND PELVIS WITH CONTRAST TECHNIQUE: Multidetector CT imaging of the abdomen and pelvis was performed using the standard protocol following bolus administration of intravenous contrast. CONTRAST:  OMNIPAQUE IOHEXOL 300 MG/ML  SOLN COMPARISON:  12/16/2012 FINDINGS: Lower chest: The visualized lung bases are clear. The visualized heart and pericardium are unremarkable. Small hiatal hernia. Hepatobiliary: No focal liver abnormality is seen. No gallstones, gallbladder wall thickening, or biliary dilatation. Pancreas: Unremarkable Spleen: Unremarkable Adrenals/Urinary Tract: Adrenal glands are unremarkable. Kidneys are normal, without renal calculi, focal lesion, or hydronephrosis. Bladder is unremarkable. Stomach/Bowel: Moderate stool seen throughout the colon without evidence of obstruction. The stomach, small bowel, and large bowel are otherwise unremarkable. Appendix normal. No free intraperitoneal gas or fluid. Vascular/Lymphatic: No significant vascular findings are present. No enlarged abdominal or pelvic lymph nodes. Reproductive: Status post hysterectomy. No adnexal masses. Other: Small fat containing umbilical hernia.  Rectum unremarkable. Musculoskeletal: No acute bone abnormality. No lytic or blastic bone lesion. IMPRESSION: No acute intra-abdominal pathology identified. No radiographic explanation for the patient's  reported symptoms. Moderate stool without evidence of obstruction. Electronically Signed   By: Helyn Numbers MD   On: 07/09/2020 06:02    Procedures Procedures   Medications Ordered in ED Medications  ondansetron [facility]) injection 4 mg (4 mg Intravenous Given 07/09/20 0350)  fentaNYL (SUBLIMAZE) injection 25 mcg (25 mcg Intravenous Given 07/09/20 0352)  fentaNYL (SUBLIMAZE) injection 50 mcg (50 mcg Intravenous Given 07/09/20 0552)  iohexol (OMNIPAQUE) 300 MG/ML solution 100 mL (100 mLs Intravenous Contrast Given 07/09/20 0538)  alum & mag hydroxide-simeth (MAALOX/MYLANTA) 200-200-20 MG/5ML suspension 30 mL (30 mLs Oral Given 07/09/20 0620)    ED Course  I have reviewed the triage vital signs and the nursing notes.  Pertinent labs & imaging results that were available during my care of the patient were reviewed by me and considered in my medical decision making (see chart for details).    MDM Rules/Calculators/A&P  Patient presents with epigastric pain.  She is overall nontoxic and vital signs are reassuring.  She has some tenderness in the epigastrium.  Considerations include but not limited to, reflux, pancreatitis, cholecystitis, ulcer disease, less likely appendicitis or obstructive process.  Patient was given pain and nausea medication.  Labs obtained.  No significant metabolic derangements.  She is hyperglycemic without evidence of DKA.  No evidence of leukocytosis.  Normal LFTs.  Lipase is normal which argues against pancreatitis or cholecystitis.  Urinalysis without obvious infection.  On recheck, she reports persistent discomfort.  She was redosed medication and CT scan was obtained to further evaluate.  CT scan without acute finding.  Suspect this may be related to ulcer or reflux.  Patient was given a GI cocktail.  Will start on omeprazole.  She states she is due for colonoscopy and has referral out for a gastroenterologist.  After history, exam, and medical workup  I feel the patient has been appropriately medically screened and is safe for discharge home. Pertinent diagnoses were discussed with the patient. Patient was given return precautions.  Final Clinical Impression(s) / ED Diagnoses Final diagnoses:  Epigastric pain    Rx / DC Orders ED Discharge Orders         Ordered    omeprazole (PRILOSEC) 20 MG capsule  Daily        07/09/20 0629           Shon Baton, MD 07/09/20 [phone]

## 2020-07-09 NOTE — Discharge Instructions (Addendum)
You were seen today for abdominal pain.  Your work-up is reassuring including CT scan.  Follow-up with gastroenterology if not improving on omeprazole.

## 2020-07-09 NOTE — ED Triage Notes (Signed)
Pt c/o abd pain and fever of 100.3 at home. Denies n/v/d

## 2020-08-30 ENCOUNTER — Emergency Department (HOSPITAL_BASED_OUTPATIENT_CLINIC_OR_DEPARTMENT_OTHER)
Admission: EM | Admit: 2020-08-30 | Discharge: 2020-08-30 | Disposition: A | Discharge status: Home / Self Care | Payer: 59 | Attending: Emergency Medicine | Admitting: Emergency Medicine

## 2020-08-30 ENCOUNTER — Encounter (HOSPITAL_BASED_OUTPATIENT_CLINIC_OR_DEPARTMENT_OTHER): Payer: Self-pay | Admitting: Emergency Medicine

## 2020-08-30 ENCOUNTER — Other Ambulatory Visit: Payer: Self-pay

## 2020-08-30 DIAGNOSIS — E119 Type 2 diabetes mellitus without complications: Secondary | ICD-10-CM | POA: Diagnosis not present

## 2020-08-30 DIAGNOSIS — Z79899 Other long term (current) drug therapy: Secondary | ICD-10-CM | POA: Insufficient documentation

## 2020-08-30 DIAGNOSIS — R21 Rash and other nonspecific skin eruption: Secondary | ICD-10-CM | POA: Diagnosis present

## 2020-08-30 DIAGNOSIS — Z7984 Long term (current) use of oral hypoglycemic drugs: Secondary | ICD-10-CM | POA: Insufficient documentation

## 2020-08-30 DIAGNOSIS — I10 Essential (primary) hypertension: Secondary | ICD-10-CM | POA: Diagnosis not present

## 2020-08-30 DIAGNOSIS — L239 Allergic contact dermatitis, unspecified cause: Secondary | ICD-10-CM | POA: Diagnosis not present

## 2020-08-30 MED ORDER — CETIRIZINE HCL 10 MG PO CHEW: 10.0000 mg | CHEWABLE_TABLET | Freq: Every day | ORAL | 0 refills | Status: AC

## 2020-08-30 MED ORDER — HYDROCORTISONE 1 % EX CREA: TOPICAL_CREAM | CUTANEOUS | 0 refills | Status: AC

## 2020-08-30 NOTE — ED Provider Notes (Signed)
[facility] EMERGENCY DEPARTMENT Provider Note   CSN: [id number] Arrival date & time: 08/30/20  8786     History Chief Complaint  Patient presents with  . Rash    Sara Thornton is a 50 y.o. female with PMH of HTN, HLD, and DM who presents the ED with complaints of rash.  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  Towards the end of her shift last night, she noticed that she had a spot on her finger that was draining clearish serous fluid.  She then proceeded to notice multiple other pinpoint bumps on her arms bilaterally.  She is unclear as to what could have been a possible trigger.  She denies any pain symptoms and only endorses itching.  She tried applying topical antibiotic cream, without any relief.  She denies any fevers, chills, new medications, difficulty breathing or cough, sore throat, GI symptoms, or any other symptoms.   HPI     Past Medical History:  Diagnosis Date  . Diabetes mellitus   . High cholesterol   . Hypertension     There are no problems to display for this patient.   Past Surgical History:  Procedure Laterality Date  . ABDOMINAL HYSTERECTOMY    . PLANTAR FASCIA RELEASE Right   . TARSAL TUNNEL RELEASE Right      OB History   No obstetric history on file.     History reviewed. No pertinent family history.  Social History   Tobacco Use  . Smoking status: Never Smoker  Substance Use Topics  . Alcohol use: Not Currently    Comment: occ  . Drug use: No    Home Medications Prior to Admission medications   Medication Sig Start Date End Date Taking? Authorizing Provider  cetirizine (ZYRTEC) 10 MG chewable tablet Chew 1 tablet (10 mg total) by mouth daily. 08/30/20 09/29/20 Yes Lorelee New, PA-C  hydrocortisone cream 1 % Apply to affected area 2 times daily 08/30/20  Yes Lorelee New, PA-C  HYDROcodone-acetaminophen (NORCO) 5-325 MG per tablet Take 2 tablets by mouth every 4 (four)  hours as needed. 12/16/12   Geoffery Lyons, MD  lisinopril (PRINIVIL,ZESTRIL) 20 MG tablet Take 20 mg by mouth daily.    [provider]  metFORMIN (GLUCOPHAGE) 500 MG tablet Take 1,000 mg by mouth 2 (two) times daily.     [provider]  omeprazole (PRILOSEC) 20 MG capsule Take 1 capsule (20 mg total) by mouth daily. 07/09/20   Horton, Mayer Masker, MD  pravastatin (PRAVACHOL) 10 MG tablet Take 10 mg by mouth daily.    [provider]    Allergies    Morphine and related  Review of Systems   Review of Systems  All other systems reviewed and are negative.   Physical Exam Updated Vital Signs BP (!) 178/81 (BP Location: Right Arm)   Pulse 65   Temp 98 F (36.7 C)   Resp 16   Ht 5\' 2"  (1.575 m)   Wt 70.8 kg   LMP 03/17/2012   SpO2 100%   BMI 28.53 kg/m   Physical Exam Vitals and nursing note reviewed. Exam conducted with a chaperone present.  Constitutional:      General: She is not in acute distress.    Appearance: Normal appearance. She is not ill-appearing, toxic-appearing or diaphoretic.  HENT:     Head: Normocephalic and atraumatic.  Eyes:     General: No scleral icterus.    Conjunctiva/sclera:  Conjunctivae normal.  Cardiovascular:     Rate and Rhythm: Normal rate.     Pulses: Normal pulses.  Pulmonary:     Effort: Pulmonary effort is normal. No respiratory distress.  Musculoskeletal:        General: Normal range of motion.     Cervical back: Normal range of motion.  Skin:    General: Skin is dry.     Findings: Rash present.     Comments: Scattered 5-10 small pinpoint papular lesions, nonerythematous, over forearms bilaterally.  Nontender to palpation.  No surrounding erythema.  No swelling.  No discharge.  No scaling.  Neurological:     Mental Status: She is alert.     GCS: GCS eye subscore is 4. GCS verbal subscore is 5. GCS motor subscore is 6.  Psychiatric:        Mood and Affect: Mood normal.        Behavior: Behavior normal.         Thought Content: Thought content normal.     ED Results / Procedures / Treatments   Labs (all labs ordered are listed, but only abnormal results are displayed) Labs Reviewed - No data to display  EKG None  Radiology No results found.  Procedures Procedures   Medications Ordered in ED Medications - No data to display  ED Course  I have reviewed the triage vital signs and the nursing notes.  Pertinent labs & imaging results that were available during my care of the patient were reviewed by me and considered in my medical decision making (see chart for details).    MDM Rules/Calculators/A&P                          Sara Thornton was evaluated in Emergency Department on 08/30/2020 for the symptoms described in the history of present illness. She was evaluated in the context of the global COVID-19 pandemic, which necessitated consideration that the patient might be at risk for infection with the SARS-CoV-2 virus that causes COVID-19. Institutional protocols and algorithms that pertain to the evaluation of patients at risk for COVID-19 are in a state of rapid change based on information released by regulatory bodies including the CDC and federal and state organizations. These policies and algorithms were followed during the patient's care in the ED.  I personally reviewed patient's medical chart and all notes from triage and staff during today's encounter. I have also ordered and reviewed all labs and imaging that I felt to be medically necessary in the evaluation of this patient's complaints and with consideration of their physical exam. If needed, translation services were available and utilized.   Patient with itchy rash comprised of 5-10 small pinpoint papular lesions on forearms bilaterally.  No erythema.  No tenderness.  She denies any fevers, new medications, or other concerning history.  Negative Nikolsky sign.  She is well-appearing.   Suspect contact/allergic  dermatitis.  Will prescribe cetirizine and topical steroids.  Given only few, scattered lesions, do not feel as though oral prednisone warranted.  There is no evidence to suggest bacterial infection or fungal etiology.  Discussed ER return precautions.  Patient with elevated BP today. Unlikely to be related to CC and patient is without symptoms concerning for end organ damage. I discussed with patient their elevated blood pressure and need for close outpatient management of their hypertension. Patient counseled on long-term effects of elevated BP including kidney damage, vascular damage, retinopathy, and risk of  stroke and other dangerous outcomes. Patient understanding of close PCP follow up.   Final Clinical Impression(s) / ED Diagnoses Final diagnoses:  Allergic contact dermatitis, unspecified trigger    Rx / DC Orders ED Discharge Orders         Ordered    hydrocortisone cream 1 %        08/30/20 1026    cetirizine (ZYRTEC) 10 MG chewable tablet  Daily        08/30/20 1026           Lorelee New, PA-C 08/30/20 1049    Horton, Clabe Seal, DO 08/31/20 0827

## 2020-08-30 NOTE — ED Notes (Signed)
Pt deneis shob or resp issues.

## 2020-08-30 NOTE — Discharge Instructions (Addendum)
Your exam is reassuring.  Suspect allergic/contact dermatitis that will be improved with topical steroid cream.  This will help with your itching sensation.  Antihistamines will also help with the itching.  Please follow-up with your primary care provider next week regarding today's ED encounter.  Return to the ER seek immediate medical attention should you expensing new or worsening symptoms.

## 2020-08-30 NOTE — ED Triage Notes (Signed)
Pt arrives pov with c/o bilateral rash to arms and hands. Pt reports first "bump" was on R index finger. Pt denies pain, endorses itching. Denies change in food or detergents. Pt denies otc meds pta

## 2021-04-13 ENCOUNTER — Encounter (HOSPITAL_BASED_OUTPATIENT_CLINIC_OR_DEPARTMENT_OTHER): Payer: Self-pay | Admitting: *Deleted

## 2021-04-13 ENCOUNTER — Other Ambulatory Visit: Payer: Self-pay

## 2021-04-13 ENCOUNTER — Emergency Department (HOSPITAL_BASED_OUTPATIENT_CLINIC_OR_DEPARTMENT_OTHER)
Admission: EM | Admit: 2021-04-13 | Discharge: 2021-04-13 | Disposition: A | Discharge status: Home / Self Care | Payer: 59 | Attending: Emergency Medicine | Admitting: Emergency Medicine

## 2021-04-13 DIAGNOSIS — Z7984 Long term (current) use of oral hypoglycemic drugs: Secondary | ICD-10-CM | POA: Diagnosis not present

## 2021-04-13 DIAGNOSIS — I10 Essential (primary) hypertension: Secondary | ICD-10-CM | POA: Diagnosis not present

## 2021-04-13 DIAGNOSIS — Z20822 Contact with and (suspected) exposure to covid-19: Secondary | ICD-10-CM | POA: Diagnosis not present

## 2021-04-13 DIAGNOSIS — E119 Type 2 diabetes mellitus without complications: Secondary | ICD-10-CM | POA: Insufficient documentation

## 2021-04-13 DIAGNOSIS — H9201 Otalgia, right ear: Secondary | ICD-10-CM | POA: Diagnosis present

## 2021-04-13 DIAGNOSIS — H6690 Otitis media, unspecified, unspecified ear: Secondary | ICD-10-CM

## 2021-04-13 DIAGNOSIS — H6691 Otitis media, unspecified, right ear: Secondary | ICD-10-CM | POA: Insufficient documentation

## 2021-04-13 DIAGNOSIS — J3489 Other specified disorders of nose and nasal sinuses: Secondary | ICD-10-CM | POA: Diagnosis not present

## 2021-04-13 DIAGNOSIS — Z79899 Other long term (current) drug therapy: Secondary | ICD-10-CM | POA: Insufficient documentation

## 2021-04-13 LAB — RESP PANEL BY RT-PCR (FLU A&B, COVID) ARPGX2
Influenza A by PCR: NEGATIVE
Influenza B by PCR: NEGATIVE
SARS Coronavirus 2 by RT PCR: NEGATIVE

## 2021-04-13 MED ORDER — AMOXICILLIN-POT CLAVULANATE 875-125 MG PO TABS: 1.0000 | ORAL_TABLET | Freq: Two times a day (BID) | ORAL | 0 refills | Status: AC

## 2021-04-13 MED ORDER — AMOXICILLIN-POT CLAVULANATE 875-125 MG PO TABS
1.0000 | ORAL_TABLET | Freq: Once | ORAL | Status: AC
  Administered 2021-04-13: 1 via ORAL
  Filled 2021-04-13: qty 1

## 2021-04-13 MED ORDER — IBUPROFEN 400 MG PO TABS
400.0000 mg | ORAL_TABLET | Freq: Once | ORAL | Status: AC
  Administered 2021-04-13: 400 mg via ORAL
  Filled 2021-04-13: qty 1

## 2021-04-13 NOTE — ED Triage Notes (Signed)
Cough, body aches, runny nose, headache, chills and sore throat. Ear pain.

## 2021-04-13 NOTE — Discharge Instructions (Addendum)
You have a right-sided ear infection, take Augmentin twice daily for the next 7 days.  Make sure you take this antibiotic with food on your stomach to help prevent stomach upset and diarrhea.  You can also use over-the-counter probiotics to help with this.  You can use Motrin and Tylenol to help with your pain.  You can also use over-the-counter decongestants to help with sinus congestion and discomfort.  Follow-up with your primary care doctor.  Return for worsening symptoms.

## 2021-04-13 NOTE — ED Provider Notes (Signed)
[facility] EMERGENCY DEPARTMENT Provider Note   CSN: [id number] Arrival date & time: 04/13/21  1740     History No chief complaint on file.   Sara Thornton is a 50 y.o. female.  Sara Thornton is a 50 y.o. female with a history of hypertension, hyperlipidemia diabetes, who presents to the emergency department for evaluation of cough, rhinorrhea, headache, chills and body aches present for the past 4 days.  Today patient began experiencing persistent severe right ear pain with no drainage, no change in hearing.  No prior history of ear infections.  She has had some chills but no documented fevers.  Reports mild sore throat that seems to be improving.  Cough has been nonproductive.  She has had some increasing sinus pain and pressure.  Unsure of sick contacts.  No chest pain or shortness of breath.  No abdominal pain, nausea or vomiting.  No other aggravating or alleviating factors.  The history is provided by the patient.      Past Medical History:  Diagnosis Date   Diabetes mellitus    High cholesterol    Hypertension     There are no problems to display for this patient.   Past Surgical History:  Procedure Laterality Date   ABDOMINAL HYSTERECTOMY     PLANTAR FASCIA RELEASE Right    TARSAL TUNNEL RELEASE Right      OB History   No obstetric history on file.     No family history on file.  Social History   Tobacco Use   Smoking status: Never  Substance Use Topics   Alcohol use: Not Currently    Comment: occ   Drug use: No    Home Medications Prior to Admission medications   Medication Sig Start Date End Date Taking? Authorizing Provider  amoxicillin-clavulanate (AUGMENTIN) 875-125 MG tablet Take 1 tablet by mouth 2 (two) times daily. One po bid x 7 days 04/13/21  Yes Dartha Lodge, PA-C  glipiZIDE (GLUCOTROL XL) 10 MG 24 hr tablet  06/30/20  Yes [provider]  lisinopril (PRINIVIL,ZESTRIL) 20 MG tablet Take 20 mg by  mouth daily.   Yes [provider]  metFORMIN (GLUCOPHAGE) 500 MG tablet Take 1,000 mg by mouth 2 (two) times daily.    Yes [provider]  omeprazole (PRILOSEC) 20 MG capsule Take 1 capsule (20 mg total) by mouth daily. 07/09/20  Yes Horton, Mayer Masker, MD  pravastatin (PRAVACHOL) 10 MG tablet Take 10 mg by mouth daily.   Yes [provider]  cetirizine (ZYRTEC) 10 MG chewable tablet Chew 1 tablet (10 mg total) by mouth daily. 08/30/20 09/29/20  Lorelee New, PA-C  HYDROcodone-acetaminophen (NORCO) 5-325 MG per tablet Take 2 tablets by mouth every 4 (four) hours as needed. 12/16/12   Geoffery Lyons, MD  hydrocortisone cream 1 % Apply to affected area 2 times daily 08/30/20   Lorelee New, PA-C    Allergies    Morphine and related  Review of Systems   Review of Systems  Constitutional:  Positive for chills and fever.  HENT:  Positive for congestion, ear pain, rhinorrhea and sore throat.   Respiratory:  Positive for cough. Negative for shortness of breath.   Cardiovascular:  Negative for chest pain.  Gastrointestinal:  Negative for abdominal pain, diarrhea, nausea and vomiting.  Musculoskeletal:  Positive for myalgias. Negative for neck pain and neck stiffness.  Skin:  Negative for color change and rash.  Neurological:  Positive for headaches.  All other systems reviewed and are negative.  Physical Exam Updated Vital Signs BP (!) 176/86 (BP Location: Right Arm)   Pulse 84   Temp 98.5 F (36.9 C) (Oral)   Resp 18   Ht 5\' 2"  (1.575 m)   Wt 68.9 kg   LMP 03/17/2012   SpO2 100%   BMI 27.80 kg/m   Physical Exam Vitals and nursing note reviewed.  Constitutional:      General: She is not in acute distress.    Appearance: She is well-developed. She is not ill-appearing or diaphoretic.  HENT:     Head: Normocephalic and atraumatic.     Right Ear: Ear canal normal. Tympanic membrane is erythematous and bulging.     Left Ear: Tympanic membrane and ear  canal normal.     Nose: Congestion and rhinorrhea present.     Comments: Congestion and rhinorrhea present with some maxillary and frontal sinus tenderness bilaterally    Mouth/Throat:     Mouth: Mucous membranes are moist.     Pharynx: Oropharynx is clear. No oropharyngeal exudate or posterior oropharyngeal erythema.  Eyes:     General:        Right eye: No discharge.        Left eye: No discharge.  Neck:     Comments: No rigidity Cardiovascular:     Rate and Rhythm: Normal rate and regular rhythm.     Heart sounds: Normal heart sounds. No murmur heard.   No friction rub. No gallop.  Pulmonary:     Effort: Pulmonary effort is normal. No respiratory distress.     Breath sounds: Normal breath sounds.     Comments: Respirations equal and unlabored, patient able to speak in full sentences, lungs clear to auscultation bilaterally  Abdominal:     General: Bowel sounds are normal. There is no distension.     Palpations: Abdomen is soft. There is no mass.     Tenderness: There is no abdominal tenderness. There is no guarding.     Comments: Abdomen soft, nondistended, nontender to palpation in all quadrants without guarding or peritoneal signs  Musculoskeletal:        General: No deformity.     Cervical back: Neck supple.  Lymphadenopathy:     Cervical: No cervical adenopathy.  Skin:    General: Skin is warm and dry.     Capillary Refill: Capillary refill takes less than 2 seconds.  Neurological:     Mental Status: She is alert and oriented to person, place, and time.  Psychiatric:        Mood and Affect: Mood normal.        Behavior: Behavior normal.    ED Results / Procedures / Treatments   Labs (all labs ordered are listed, but only abnormal results are displayed) Labs Reviewed  RESP PANEL BY RT-PCR (FLU A&B, COVID) [id number]    EKG None  Radiology No results found.  Procedures Procedures   Medications Ordered in ED Medications  ibuprofen (ADVIL) tablet 400 mg (400  mg Oral Given 04/13/21 1750)  amoxicillin-clavulanate (AUGMENTIN) 875-125 MG per tablet 1 tablet (1 tablet Oral Given 04/13/21 2115)    ED Course  I have reviewed the triage vital signs and the nursing notes.  Pertinent labs & imaging results that were available during my care of the patient were reviewed by me and considered in my medical decision making (see chart for details).    MDM Rules/Calculators/A&P  Patient presents with otalgia and exam consistent with acute otitis media.  Also has other URI symptoms, but negative COVID and flu testing.  Well-appearing on exam, hypertensive but vitals otherwise normal.  No concern for acute mastoiditis, meningitis.  Patient discharged home with Augmentin.  Advised patient on symptomatic treatment and outpatient follow-up as well as return precautions.  Discharged home in good condition.  Final Clinical Impression(s) / ED Diagnoses Final diagnoses:  Acute otitis media, unspecified otitis media type    Rx / DC Orders ED Discharge Orders          Ordered    amoxicillin-clavulanate (AUGMENTIN) 875-125 MG tablet  2 times daily        04/13/21 2111             Legrand Rams 04/13/21 2121    Benjiman Core, MD 04/14/21 0010

## 2023-01-09 IMAGING — CT CT ABD-PELV W/ CM
2 of 5 series · 15 of 46 positions shown, 17 images · IV contrast (Omnipaque)
Comparison: 12/16/2012

CLINICAL DATA: Abdominal pain, fever

EXAM:
CT ABDOMEN AND PELVIS WITH CONTRAST
TECHNIQUE: Multidetector CT imaging of the abdomen and pelvis was performed
using the standard protocol following bolus administration of
intravenous contrast.
CONTRAST:  100mL OMNIPAQUE IOHEXOL 300 MG/ML  SOLN

[Series 3: axial st · axial · 0.94mm/px · z∈[+682,+1112]mm · 12 of 98 slices shown, 14 images]
[im 6/98  soft-tissue]
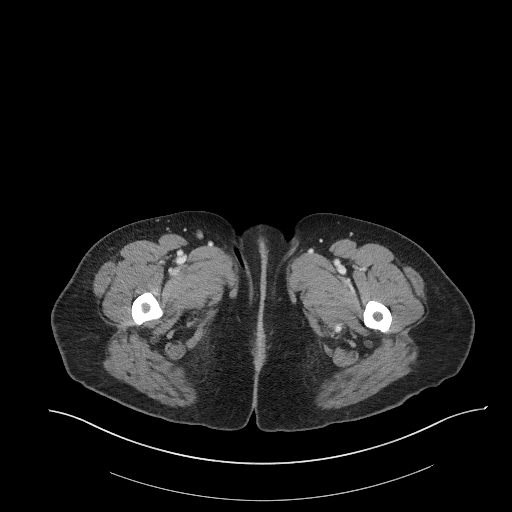
[im 6/98  bone]
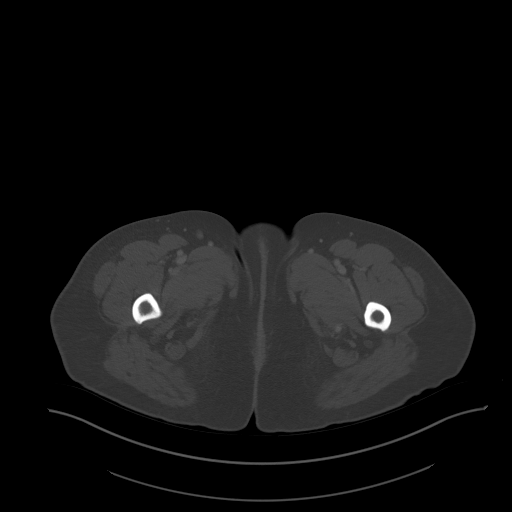
[im 16/98  soft-tissue]
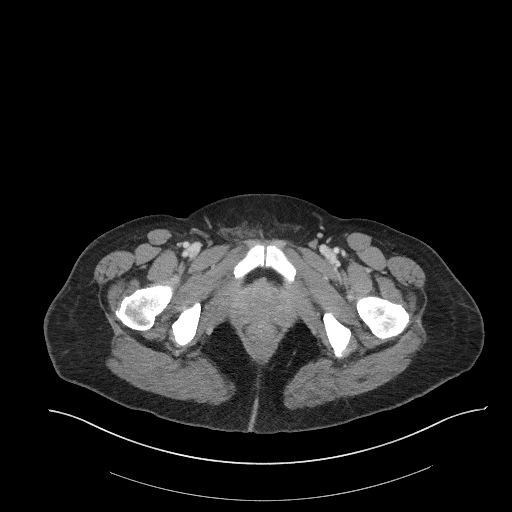
[im 21/98  soft-tissue]
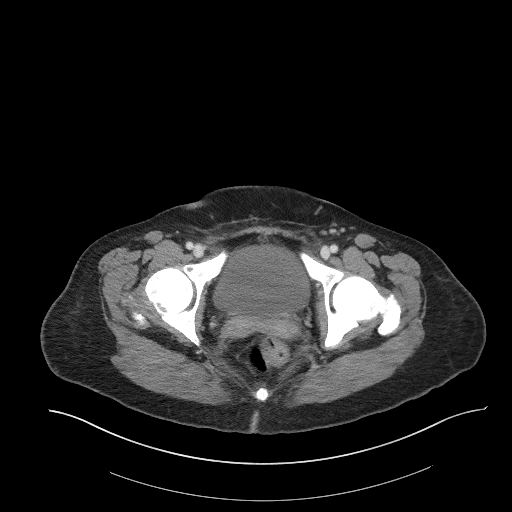
[im 31/98  soft-tissue]
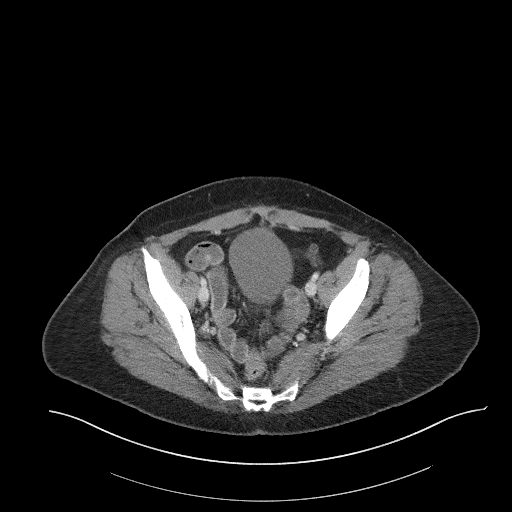
[im 36/98  soft-tissue]
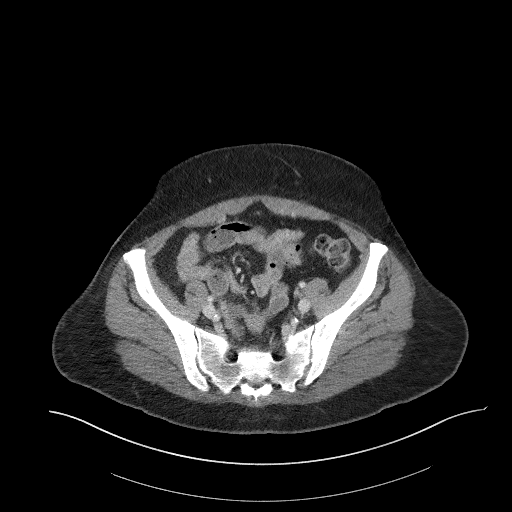
[im 46/98  soft-tissue]
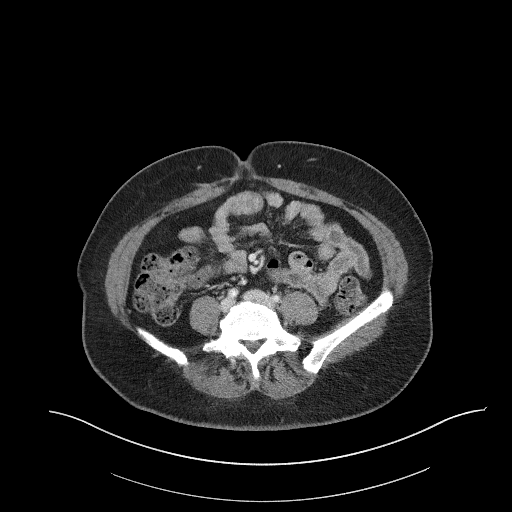
[im 52/98  soft-tissue]
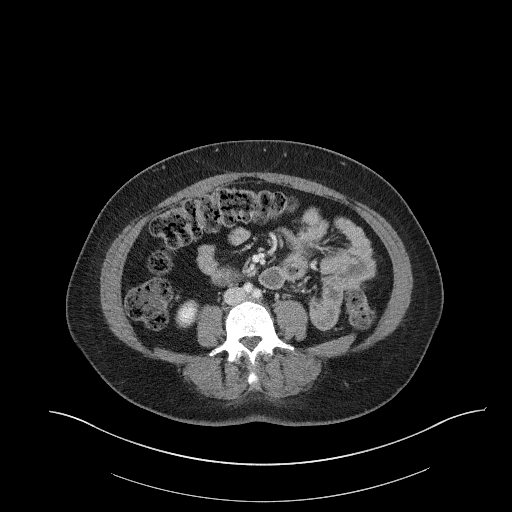
[im 62/98  soft-tissue]
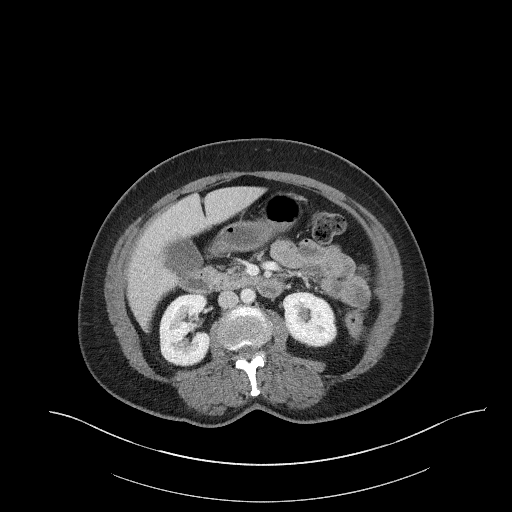
[im 67/98  soft-tissue]
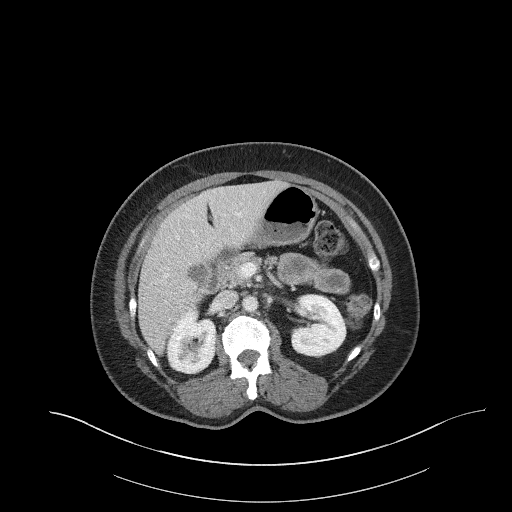
[im 67/98  bone]
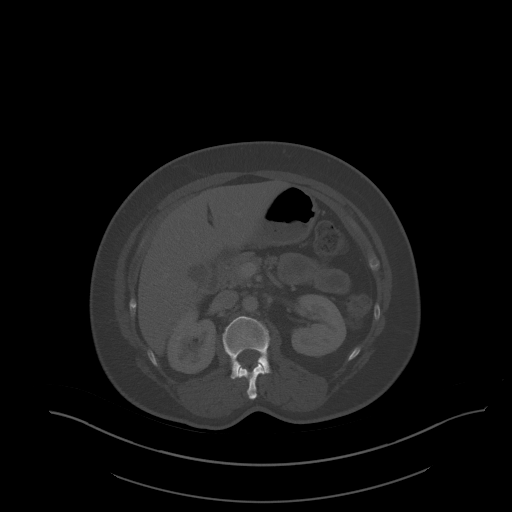
[im 77/98  soft-tissue]
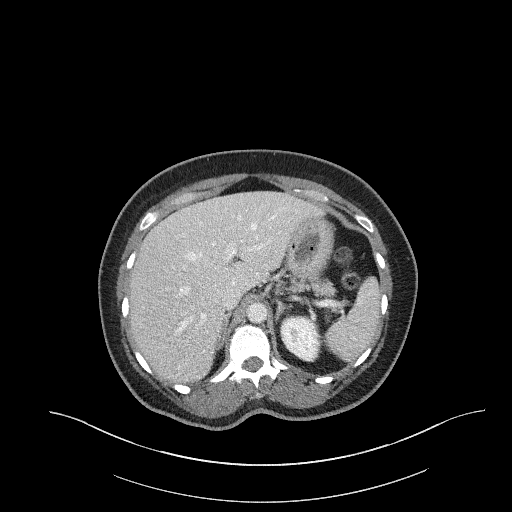
[im 82/98  soft-tissue]
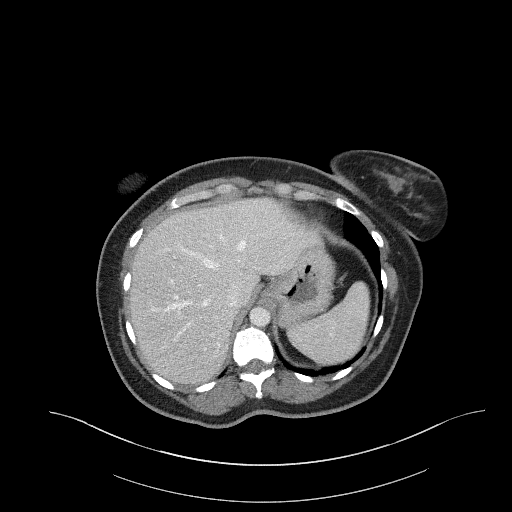
[im 92/98  soft-tissue]
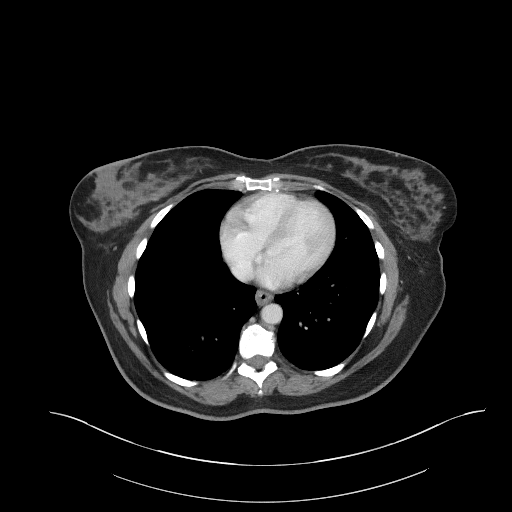

[Series 6: coronal st · coronal · 0.64mm/px · 3 of 86 slices shown]
[im 29/86  soft-tissue]
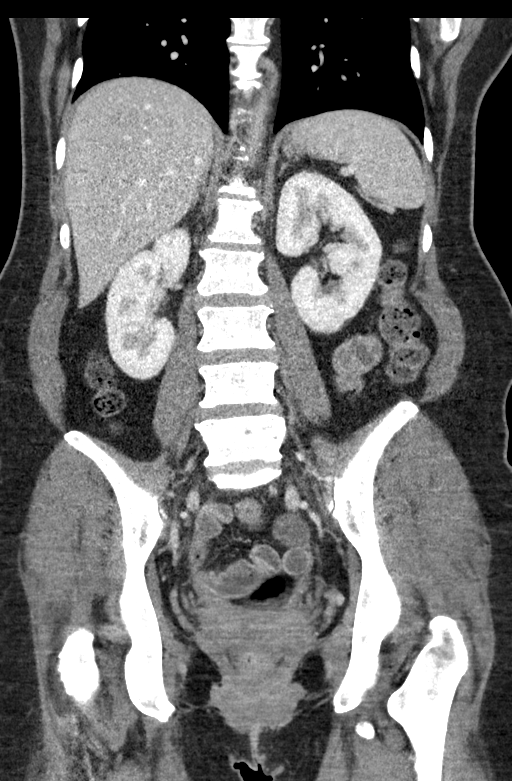
[im 38/86  soft-tissue]
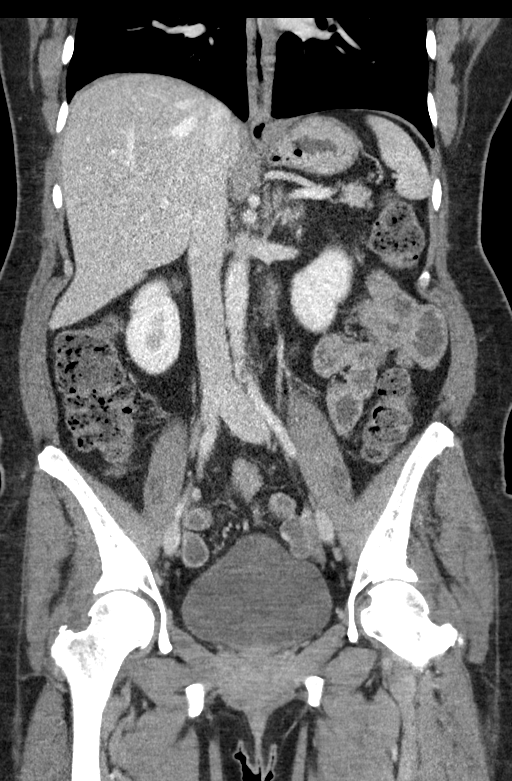
[im 48/86  soft-tissue]
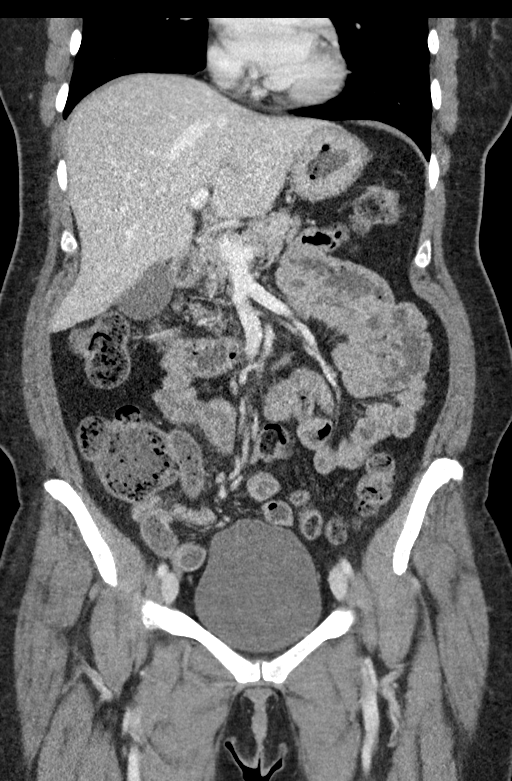

[15 of 46 positions shown; findings below may reference images not displayed]

FINDINGS: Lower chest: The visualized lung bases are clear. The visualized
heart and pericardium are unremarkable. Small hiatal hernia.

Hepatobiliary: No focal liver abnormality is seen. No gallstones,
gallbladder wall thickening, or biliary dilatation.

Pancreas: Unremarkable

Spleen: Unremarkable

Adrenals/Urinary Tract: Adrenal glands are unremarkable. Kidneys are
normal, without renal calculi, focal lesion, or hydronephrosis.
Bladder is unremarkable.

Stomach/Bowel: Moderate stool seen throughout the colon without
evidence of obstruction. The stomach, small bowel, and large bowel
are otherwise unremarkable. Appendix normal. No free intraperitoneal
gas or fluid.

Vascular/Lymphatic: No significant vascular findings are present. No
enlarged abdominal or pelvic lymph nodes.

Reproductive: Status post hysterectomy. No adnexal masses.

Other: Small fat containing umbilical hernia.  Rectum unremarkable.

Musculoskeletal: No acute bone abnormality. No lytic or blastic bone
lesion.
IMPRESSION: No acute intra-abdominal pathology identified. No radiographic
explanation for the patient's reported symptoms.

Moderate stool without evidence of obstruction.
